# Patient Record
Sex: Female | Born: 1980 | Race: White | Hispanic: No | Marital: Married | Smoking: Never smoker
Health system: Southern US, Community
[De-identification: ages and names within clinical notes are randomized; demographics above are authoritative.]

## PROBLEM LIST (undated history)

## (undated) DIAGNOSIS — R51 Headache: Secondary | ICD-10-CM

## (undated) DIAGNOSIS — T7840XA Allergy, unspecified, initial encounter: Secondary | ICD-10-CM

## (undated) DIAGNOSIS — R519 Headache, unspecified: Secondary | ICD-10-CM

## (undated) DIAGNOSIS — E0500 Thyrotoxicosis with diffuse goiter without thyrotoxic crisis or storm: Secondary | ICD-10-CM

## (undated) DIAGNOSIS — G8929 Other chronic pain: Secondary | ICD-10-CM

## (undated) HISTORY — DX: Allergy, unspecified, initial encounter: T78.40XA

## (undated) HISTORY — DX: Other chronic pain: G89.29

## (undated) HISTORY — DX: Headache, unspecified: R51.9

## (undated) HISTORY — DX: Thyrotoxicosis with diffuse goiter without thyrotoxic crisis or storm: E05.00

## (undated) HISTORY — DX: Headache: R51

---
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
## 2004-01-20 ENCOUNTER — Ambulatory Visit: Payer: Self-pay | Admitting: Internal Medicine

## 2004-07-26 ENCOUNTER — Ambulatory Visit: Payer: Self-pay | Admitting: Internal Medicine

## 2005-05-03 ENCOUNTER — Ambulatory Visit: Payer: Self-pay | Admitting: Obstetrics & Gynecology

## 2005-05-18 ENCOUNTER — Ambulatory Visit (HOSPITAL_COMMUNITY): Admission: RE | Admit: 2005-05-18 | Discharge: 2005-05-18 | Payer: Self-pay | Admitting: Obstetrics & Gynecology

## 2005-05-24 ENCOUNTER — Ambulatory Visit: Payer: Self-pay | Admitting: Obstetrics & Gynecology

## 2008-01-09 HISTORY — PX: OTHER SURGICAL HISTORY: SHX169

## 2008-10-28 ENCOUNTER — Encounter: Payer: Self-pay | Admitting: Obstetrics and Gynecology

## 2009-06-23 ENCOUNTER — Encounter: Payer: Self-pay | Admitting: Obstetrics & Gynecology

## 2009-11-03 ENCOUNTER — Observation Stay: Payer: Self-pay

## 2009-11-04 ENCOUNTER — Ambulatory Visit: Payer: Self-pay

## 2009-11-21 ENCOUNTER — Observation Stay: Payer: Self-pay

## 2010-01-31 ENCOUNTER — Inpatient Hospital Stay: Payer: Self-pay

## 2010-06-09 ENCOUNTER — Ambulatory Visit: Payer: Self-pay | Admitting: General Practice

## 2012-01-09 HISTORY — PX: DILATION AND EVACUATION: SHX1459

## 2012-04-12 IMAGING — CT CT CHEST W/ CM
2 series · 15 of 31 positions shown, 19 images · IV contrast (APPLIED)
Comparison: none

REASON FOR EXAM: Stat CR [id number] shortness of breath eval for PE on
[employer]
COMMENTS:

[Series 4: soft tissue · axial · 0.62mm/px · z∈[-562,-520]mm · 2 of 89 slices shown]
[im 7/89  mediastinal]
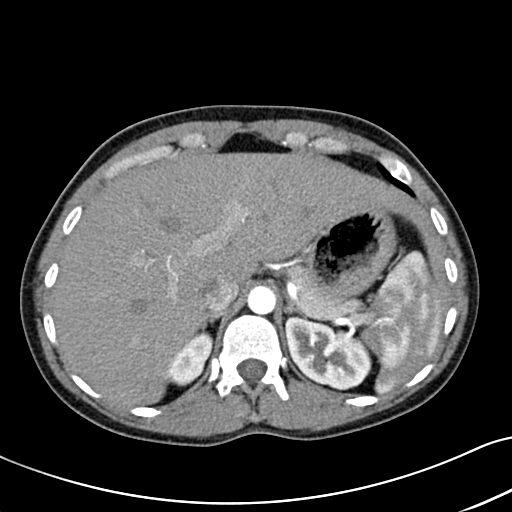
[im 21/89  mediastinal]
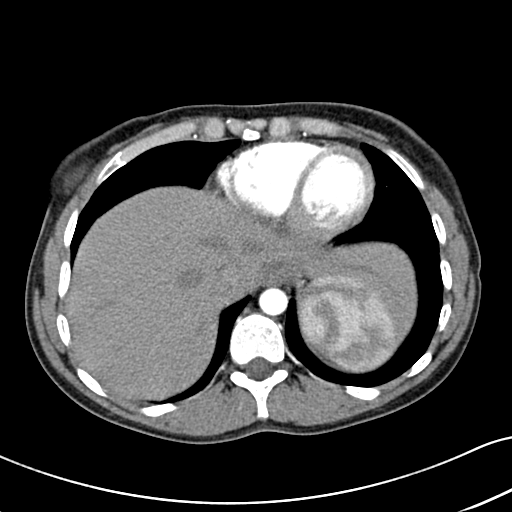

[Series 5: lung windows · axial · 0.62mm/px · z∈[-556,-336]mm · 13 of 87 slices shown, 17 images]
[im 7/87  mediastinal]
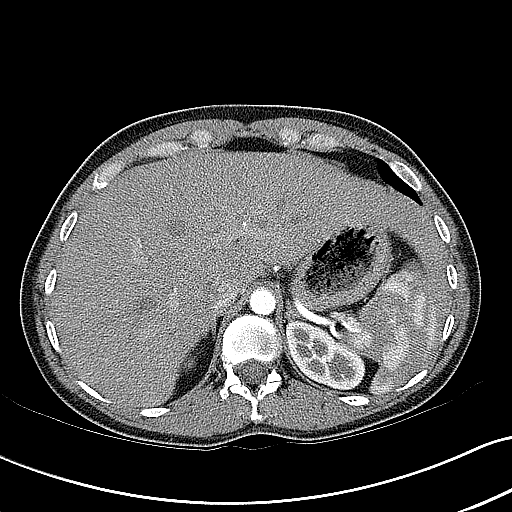
[im 7/87  lung]
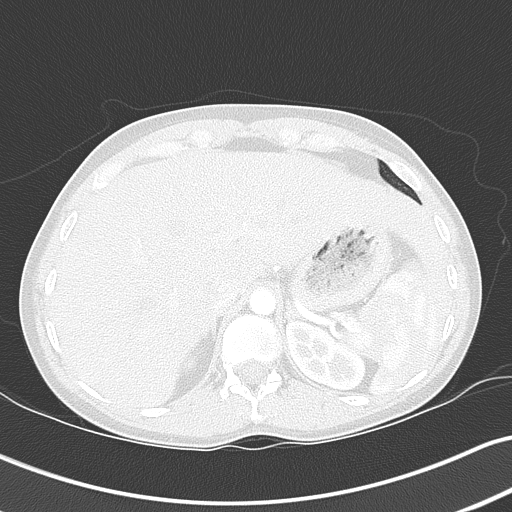
[im 14/87  lung]
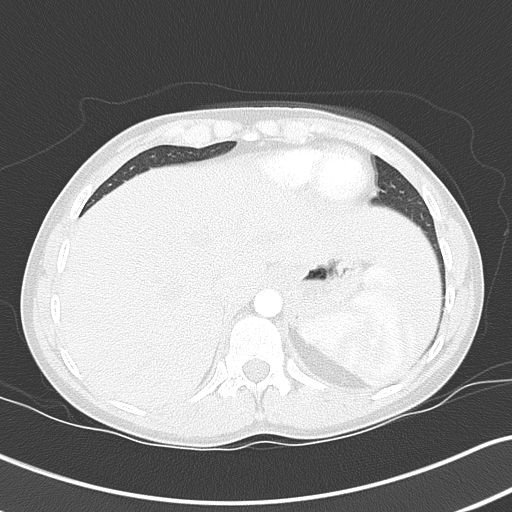
[im 20/87  lung]
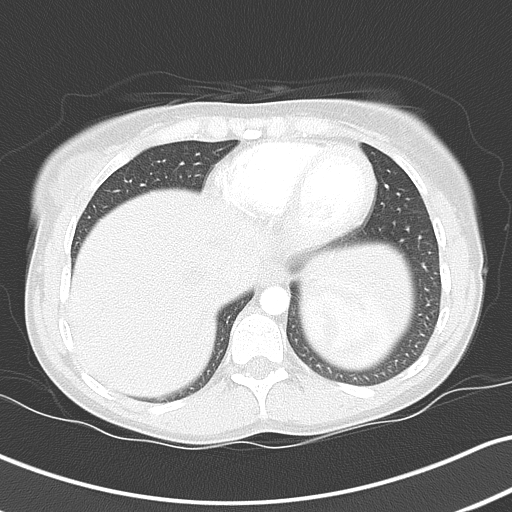
[im 27/87  lung]
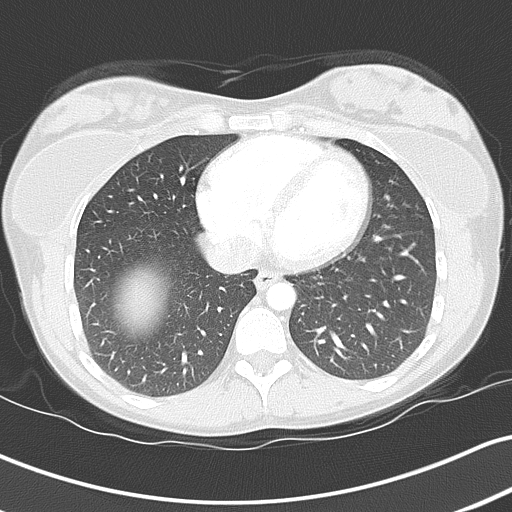
[im 34/87  mediastinal]
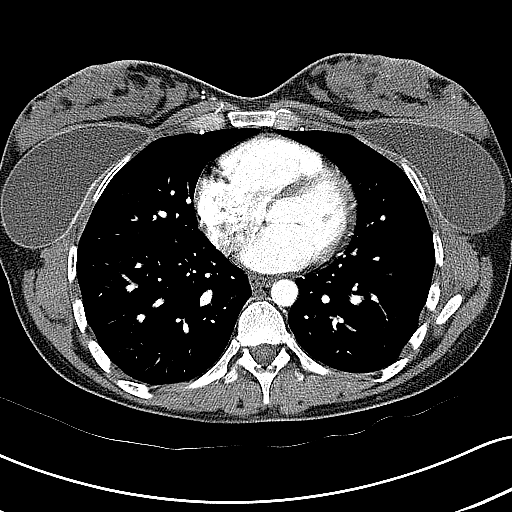
[im 34/87  lung]
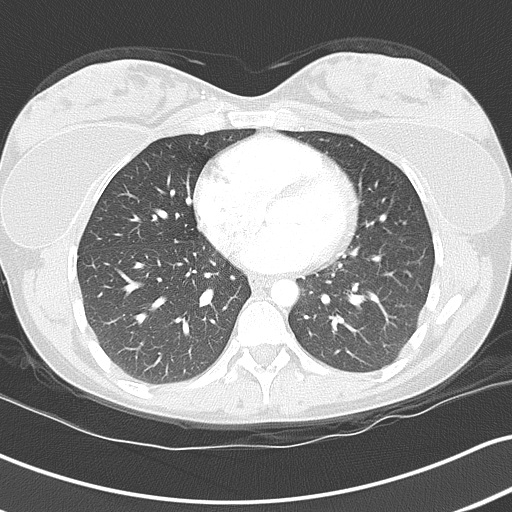
[im 40/87  lung]
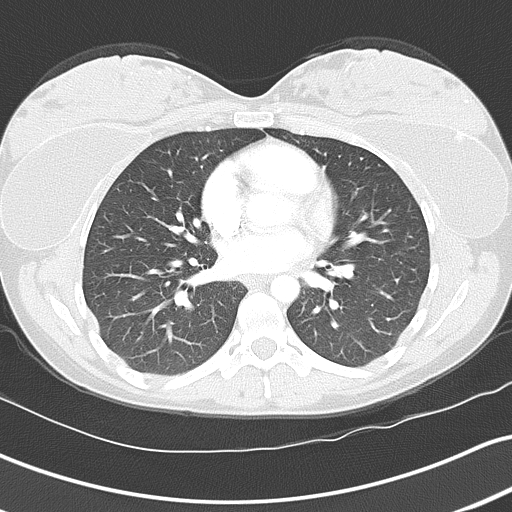
[im 44/87  lung]
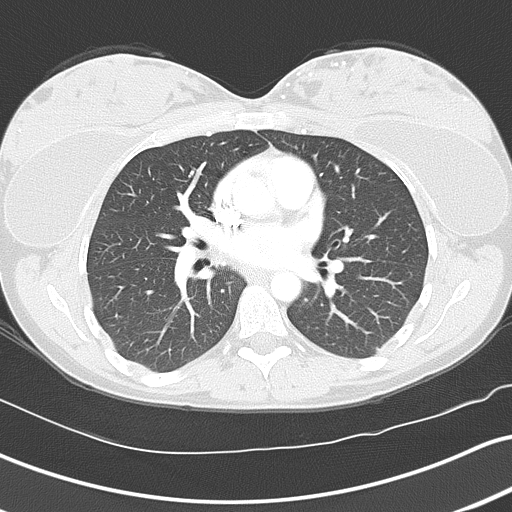
[im 47/87  lung]
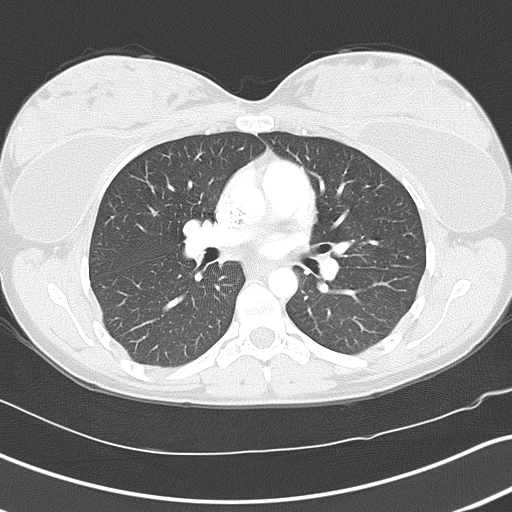
[im 53/87  mediastinal]
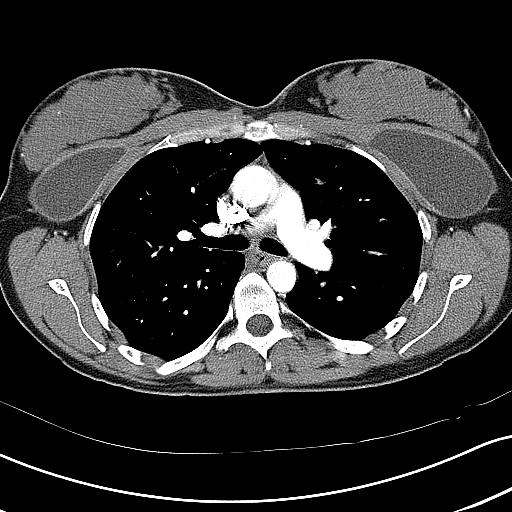
[im 53/87  lung]
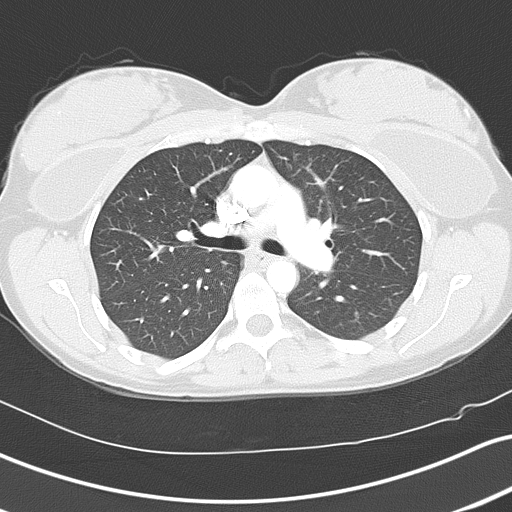
[im 60/87  lung]
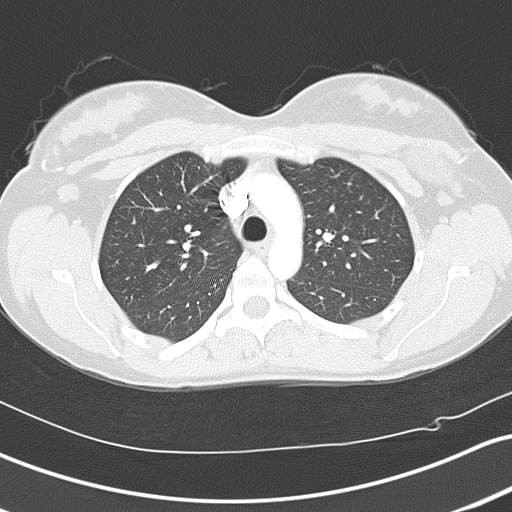
[im 67/87  lung]
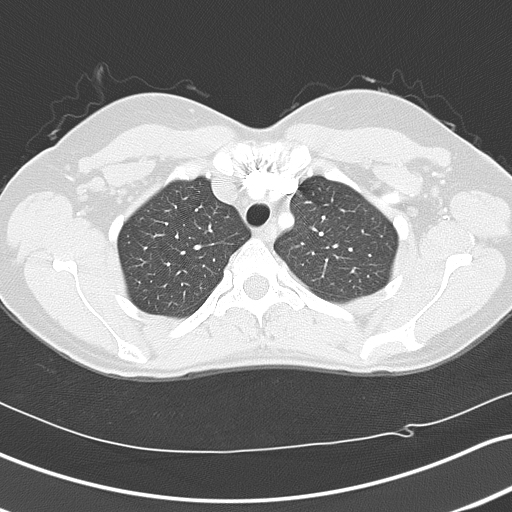
[im 73/87  lung]
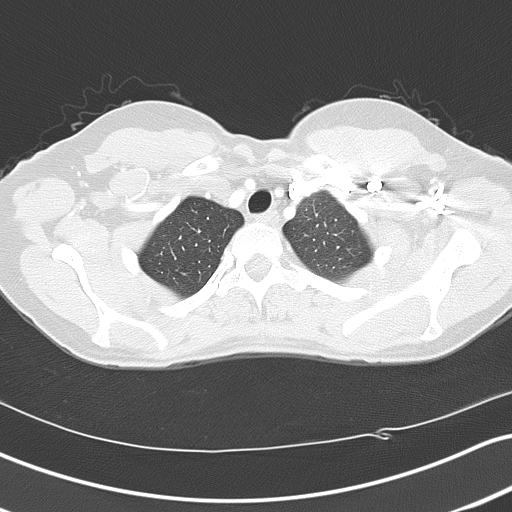
[im 80/87  mediastinal]
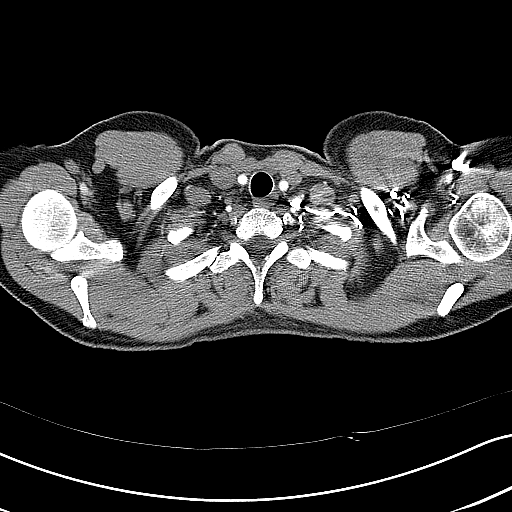
[im 80/87  lung]
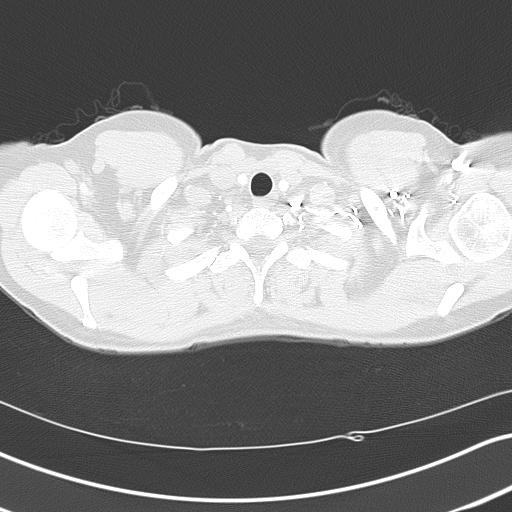

[15 of 31 positions shown; findings below may reference images not displayed]

PROCEDURE:     CT  - CT CHEST (FOR PE) W  - June 09, 2010  [DATE]

RESULT:     Axial CT scanning was performed through the chest at 3 mm
intervals and slice thicknesses. Review of multiplanar reconstructed images
was performed separately on the [employer] monitor. The patient
received 100 cc of Tsovue-7IB for this study.

Contrast within the pulmonary arterial tree is normal in appearance. There
is no evidence of an acute pulmonary embolism. The caliber of the thoracic
aorta is normal. The cardiac chambers are normal in size. The thoracic
esophagus appears normal. There are a few normal sized to borderline
enlarged axillary lymph nodes. I see no pathologic sized mediastinal or
hilar lymph nodes. There is no pleural nor pericardial effusion. There are
bilateral breast implants.

At lung window settings I see no interstitial nor alveolar infiltrates. No
suspicious pulmonary parenchymal nodules are demonstrated. Within the upper
abdomen the observed portions of the liver and spleen are normal. I see no
adrenal masses.
IMPRESSION: 1. There is no evidence of an acute pulmonary embolism.
2. I see no acute thoracic aortic pathology.
3. There is no evidence of CHF nor of pneumonia.
4. There are a few borderline enlarged axillary lymph nodes bilaterally.

A preliminary report was called to the number listed and the report left on
the answering machine of [employer].

## 2012-11-12 ENCOUNTER — Emergency Department: Payer: Self-pay | Admitting: Emergency Medicine

## 2012-11-12 LAB — CBC WITH DIFFERENTIAL/PLATELET
Basophil %: 0.3 %
HCT: 38 % (ref 35.0–47.0)
HGB: 13.6 g/dL (ref 12.0–16.0)
Lymphocyte #: 2.4 10*3/uL (ref 1.0–3.6)
Lymphocyte %: 18.5 %
MCH: 33.4 pg (ref 26.0–34.0)
MCHC: 35.7 g/dL (ref 32.0–36.0)
MCV: 94 fL (ref 80–100)
RBC: 4.06 10*6/uL (ref 3.80–5.20)
WBC: 13.1 10*3/uL — ABNORMAL HIGH (ref 3.6–11.0)

## 2012-11-12 LAB — TROPONIN I: Troponin-I: <0.02 ng/mL

## 2012-11-12 LAB — CK TOTAL AND CKMB (NOT AT ARMC)
CK, Total: 45 U/L (ref 21–215)
CK-MB: 0.7 ng/mL (ref 0.5–3.6)

## 2012-11-12 LAB — COMPREHENSIVE METABOLIC PANEL
Alkaline Phosphatase: 68 U/L (ref 50–136)
Bilirubin,Total: 0.5 mg/dL (ref 0.2–1.0)
Co2: 26 mmol/L (ref 21–32)
Glucose: 83 mg/dL (ref 65–99)
Potassium: 3.5 mmol/L (ref 3.5–5.1)
SGOT(AST): 23 U/L (ref 15–37)
SGPT (ALT): 17 U/L (ref 12–78)
Sodium: 137 mmol/L (ref 136–145)
Total Protein: 6.9 g/dL (ref 6.4–8.2)

## 2013-06-04 ENCOUNTER — Encounter: Payer: Self-pay | Admitting: General Surgery

## 2013-06-23 ENCOUNTER — Encounter: Payer: Self-pay | Admitting: General Surgery

## 2013-06-23 ENCOUNTER — Other Ambulatory Visit: Payer: BC Managed Care – PPO

## 2013-06-23 ENCOUNTER — Ambulatory Visit (INDEPENDENT_AMBULATORY_CARE_PROVIDER_SITE_OTHER): Payer: BC Managed Care – PPO | Admitting: General Surgery

## 2013-06-23 ENCOUNTER — Ambulatory Visit: Payer: Self-pay | Admitting: General Surgery

## 2013-06-23 VITALS — BP 118/68 | HR 74 | Resp 14 | Ht 62.0 in | Wt 136.0 lb

## 2013-06-23 DIAGNOSIS — N63 Unspecified lump in unspecified breast: Secondary | ICD-10-CM

## 2013-06-23 HISTORY — PX: BREAST BIOPSY: SHX20

## 2013-06-23 NOTE — Patient Instructions (Signed)

## 2013-06-23 NOTE — Progress Notes (Addendum)
Patient ID: Rachel Schroeder, female   DOB: June 23, 1980, 33 y.o.   MRN: [id number]  Chief Complaint  Patient presents with  . Other    right breast lump    HPI Rachel Schroeder is a 33 y.o. female who presents for a breast evaluation. Right breast ultrasound 05/09/13.  Patient states she feels a right breast lump that she noticed it approximately 8 weeks ago. She states the area has stayed the same in size.  No known family history of breast cancer. The patient is currently breast feeding. She nursed her first child, now H3 for 2 years without incident. Her second child, 41-1/2 months old has nursed without difficulty. No episodes of mastitis. No trauma to the breast.  HPI  Past Medical History  Diagnosis Date  . Graves disease   . Chronic headaches   . Allergy     Past Surgical History  Procedure Laterality Date  . Breast surgery  2001    implants  . Thyroid ablation  2010  . Dilation and evacuation  2014    History reviewed. No pertinent family history.  Social History History  Substance Use Topics  . Smoking status: Never Smoker   . Smokeless tobacco: Never Used  . Alcohol Use: No    No Known Allergies  Current Outpatient Prescriptions  Medication Sig Dispense Refill  . levothyroxine (SYNTHROID, LEVOTHROID) 112 MCG tablet Take 112 mcg by mouth daily before breakfast.      . loratadine (CLARITIN) 10 MG tablet Take 10 mg by mouth daily as needed for allergies.      . Prenatal Vit-Fe Fumarate-FA (PRENATAL MULTIVITAMIN) TABS tablet Take 1 tablet by mouth daily at 12 noon.      . Progesterone Micronized (PROGESTERONE PO) Take 1 tablet by mouth daily.       No current facility-administered medications for this visit.    Review of Systems Review of Systems  Constitutional: Negative.   Respiratory: Negative.   Cardiovascular: Negative.     Blood pressure 118/68, pulse 74, resp. rate 14, height 5\' 2"  (1.575 m), weight 136 lb (61.689 kg), last menstrual period  06/23/2012.  Physical Exam Physical Exam  Constitutional: She is oriented to person, place, and time. She appears well-developed and well-nourished.  Neck: Neck supple. No thyromegaly present.  Cardiovascular: Normal rate, regular rhythm and normal heart sounds.   No murmur heard. Pulmonary/Chest: Effort normal and breath sounds normal. Right breast exhibits mass. Right breast exhibits no inverted nipple, no nipple discharge, no skin change and no tenderness. Left breast exhibits no inverted nipple, no mass, no nipple discharge, no skin change and no tenderness.    Lymphadenopathy:    She has no cervical adenopathy.    She has no axillary adenopathy.  Neurological: She is alert and oriented to person, place, and time.  Skin: Skin is warm and dry.    Data Reviewed EXAM INFO:  [id number] UN 05/29/13 15:28:41 [id number] [facility]) : US BREAST LIMITED RIGHT  DICTATED: 05/29/13 15:36:00  INTERPRETATION LOCATION: [place]  CLINICAL INDICATION: 33 Year Old (F) with 611.72 - Lump in lactating female breast, LUMP R SIDE 11 OCLOCK 1.5CM  TECHNIQUE: Right targeted diagnostic breast ultrasound  BREAST COMPOSITION: b. There are scattered areas of fibroglandular density.  FINDINGS:  Targeted ultrasound of the right breast upper outer quadrant at the specific site of clinical concern is negative for suspicious masses or associated features. Incidentally noted is her right breast implant  COMPARISON: None  ASSESSMENT: BI-RADS CATEGORY 2: BENIGN.  MANAGEMENT RECOMMENDATIONS: Clinical followup.  These results and recommendations were discussed with the patient.   Ultrasound examination of the palpable mass in the upper-outer quadrant of the right breast at the 11:00 position, 12 cm from the nipple shows a hyperechoic lesion with ill-defined borders measuring 1.5 x 2.3 x 2.53 cm. This is located just above the pectoral fascia and above the underlying implant. Focal cystic areas were identified. No  increased vascularity on duplex imaging.  Due the appearance of this unusual lesion core biopsy was recommended and amenable to the patient. 10 cc of 0.5% Xylocaine with 0.25% Marcaine with 1-200,000 units of epinephrine was instilled under ultrasound guidance. The majority was deposited the local lesion to elevate the mass earlier away from the pectoralis fascia and the underlying implant.  Under ultrasound guidance a 14-gauge Finesse device was advanced into the lesion. The device was pre-fired to minimize risk to the underlying implant. Multiple core samples were obtained. Moderate vascularity was noted as each biopsy sample resulted in a blush of fluid. When the biopsy device was removed after 5 core samples approximately 5 cc of blood was expressed from the wound. Direct pressure was held for 5 minutes. Repeat ultrasound imaging showed no hematoma. The skin defect was closed with benzoin and Steri-Strips followed by Telfa and Tegaderm dressing. The patient tolerated the procedure well.  Assessment    Right breast mass.    Plan    The patient was provided written instructions regarding wound care. She'll be contacted when the pathology results are available. The specimen was handcarried to pathology.      PCP: Deliah Boston 06/24/2013, 12:31 PM

## 2013-06-24 ENCOUNTER — Telehealth: Payer: Self-pay | Admitting: General Surgery

## 2013-06-24 DIAGNOSIS — N63 Unspecified lump in unspecified breast: Secondary | ICD-10-CM | POA: Insufficient documentation

## 2013-06-24 LAB — PATHOLOGY

## 2013-06-24 NOTE — Telephone Encounter (Signed)
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  The patient has been notified of these results. She reports moderate soreness and bruising. She will continue use ice today and then initiated he tomorrow. Nursing follow up in one week. We'll plan on keeping the position followup scheduled for one month.

## 2013-06-30 ENCOUNTER — Ambulatory Visit (INDEPENDENT_AMBULATORY_CARE_PROVIDER_SITE_OTHER): Payer: BC Managed Care – PPO | Admitting: *Deleted

## 2013-06-30 ENCOUNTER — Ambulatory Visit: Payer: BC Managed Care – PPO

## 2013-06-30 DIAGNOSIS — N63 Unspecified lump in unspecified breast: Secondary | ICD-10-CM

## 2013-06-30 NOTE — Progress Notes (Signed)
Patient came in today for a wound check right breast excision . The wound is clean, with no signs of infection noted.   Dressing and steri strip was removed.  Minimal bruising noted.  The patient is aware that a heating pad may be used for comfort as needed.  Aware of pathology.  Follow up as scheduled.

## 2013-07-01 ENCOUNTER — Ambulatory Visit: Payer: Self-pay | Admitting: General Surgery

## 2013-07-28 ENCOUNTER — Ambulatory Visit: Payer: BC Managed Care – PPO | Admitting: General Surgery

## 2013-08-06 ENCOUNTER — Ambulatory Visit: Payer: BC Managed Care – PPO | Admitting: General Surgery

## 2013-08-17 ENCOUNTER — Ambulatory Visit (INDEPENDENT_AMBULATORY_CARE_PROVIDER_SITE_OTHER): Payer: BC Managed Care – PPO | Admitting: General Surgery

## 2013-08-17 ENCOUNTER — Encounter: Payer: Self-pay | Admitting: General Surgery

## 2013-08-17 VITALS — BP 118/74 | HR 76 | Resp 12 | Ht 64.0 in | Wt 134.0 lb

## 2013-08-17 DIAGNOSIS — D241 Benign neoplasm of right breast: Secondary | ICD-10-CM

## 2013-08-17 DIAGNOSIS — D249 Benign neoplasm of unspecified breast: Secondary | ICD-10-CM

## 2013-08-17 DIAGNOSIS — N63 Unspecified lump in unspecified breast: Secondary | ICD-10-CM

## 2013-08-17 NOTE — Progress Notes (Signed)
Patient ID: Rachel Schroeder, female   DOB: 1980-01-13, 33 y.o.   MRN: [id number]  Chief Complaint  Patient presents with  . Follow-up    excision right breast follow up    HPI Rachel Schroeder is a 33 y.o. female here today following up from her right breast biopsy done on 06/23/13. Patient states she is doing well.  HPI  Past Medical History  Diagnosis Date  . Graves disease   . Chronic headaches   . Allergy     Past Surgical History  Procedure Laterality Date  . Thyroid ablation  2010  . Dilation and evacuation  2014  . Breast surgery  2001  . Breast biopsy Right 06/23/13    No family history on file.  Social History History  Substance Use Topics  . Smoking status: Never Smoker   . Smokeless tobacco: Never Used  . Alcohol Use: No    No Known Allergies  Current Outpatient Prescriptions  Medication Sig Dispense Refill  . levothyroxine (SYNTHROID, LEVOTHROID) 112 MCG tablet Take 112 mcg by mouth daily before breakfast.      . loratadine (CLARITIN) 10 MG tablet Take 10 mg by mouth daily as needed for allergies.      . Prenatal Vit-Fe Fumarate-FA (PRENATAL MULTIVITAMIN) TABS tablet Take 1 tablet by mouth daily at 12 noon.      . Progesterone Micronized (PROGESTERONE PO) Take 1 tablet by mouth daily.       No current facility-administered medications for this visit.    Review of Systems Review of Systems  Constitutional: Negative.   Respiratory: Negative.   Cardiovascular: Negative.     Blood pressure 118/74, pulse 76, resp. rate 12, height 5\' 4"  (1.626 m), weight 134 lb (60.782 kg).  Physical Exam Physical Exam  Constitutional: She is oriented to person, place, and time. She appears well-developed and well-nourished.  Eyes: Conjunctivae are normal. No scleral icterus.  Cardiovascular: Normal rate, regular rhythm and normal heart sounds.   Pulmonary/Chest: Effort normal and breath sounds normal. Right breast exhibits no inverted nipple, no mass, no nipple  discharge, no skin change and no tenderness. Left breast exhibits no inverted nipple, no nipple discharge, no skin change and no tenderness.    Neurological: She is alert and oriented to person, place, and time.  Skin: Skin is warm and dry.    Data Reviewed Pathology showed changes consistent with a lactating adenoma at the time of her July 2015 biopsy.  Assessment    Benign breast exam.     Plan    The patient was informed that the prominence in the right axillary tail should fade within the first 6-12 months after she discontinues nursing. If this does not occur, if the area largest or become symptomatic she was encouraged to call for reassessment.   PCP: Deliah Boston 08/17/2013, 8:49 PM

## 2013-08-17 NOTE — Patient Instructions (Signed)
Patient to return as needed. 

## 2013-11-09 ENCOUNTER — Encounter: Payer: Self-pay | Admitting: General Surgery

## 2014-09-16 IMAGING — US US EXTREM LOW VENOUS BILAT
1 series · 14 of 24 positions shown · non-contrast
Comparison: None.

CLINICAL DATA: Chest pain. Leg swelling.

EXAM:
VENOUS DOPPLER ULTRASOUND OF BILATERAL LOWER EXTREMITIES
TECHNIQUE: Gray-scale sonography with graded compression, as well as color
Doppler and duplex ultrasound, were performed to evaluate the deep
venous system from the level of the common femoral vein through the
popliteal and proximal calf veins. Spectral Doppler was utilized to
evaluate flow at rest and with distal augmentation maneuvers.

[Series 1: us extrem low venous bilat · 0.08mm/px · 14 of 76 slices shown]
[im 1/76]
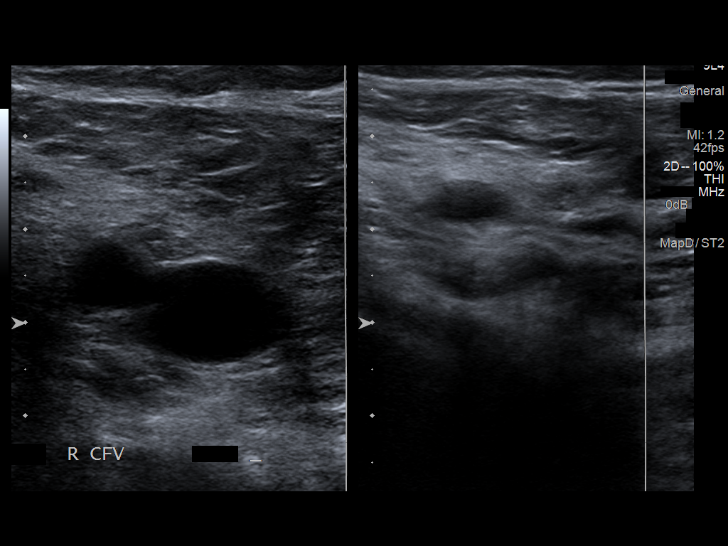
[im 7/76]
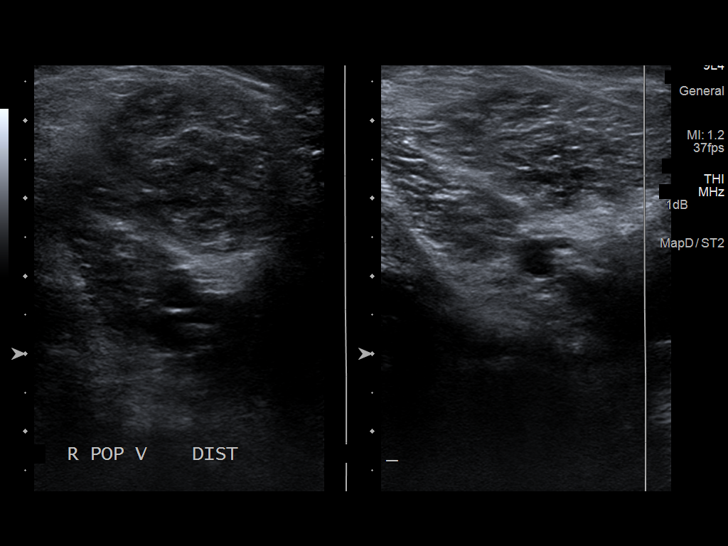
[im 14/76]
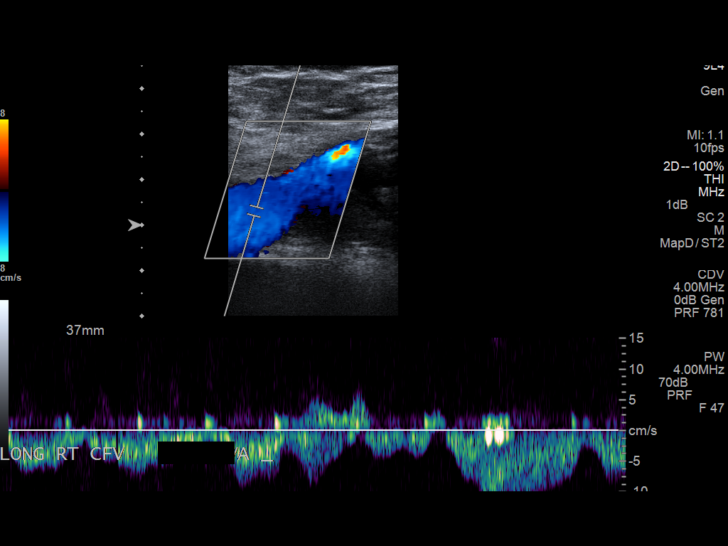
[im 20/76]
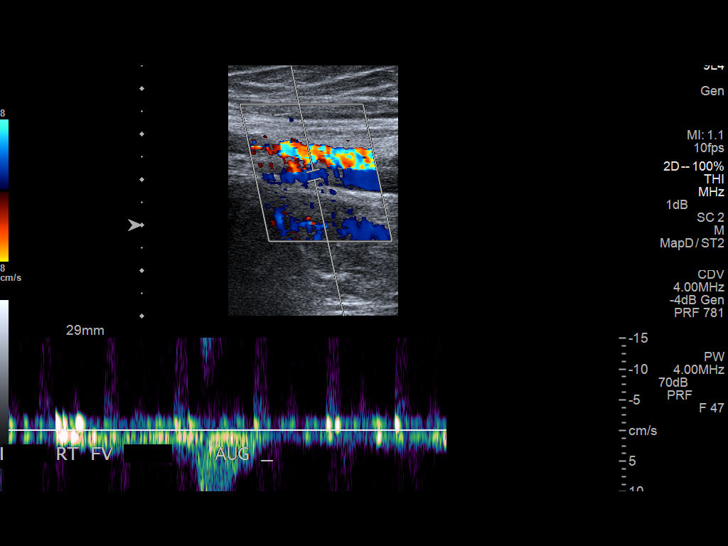
[im 23/76]
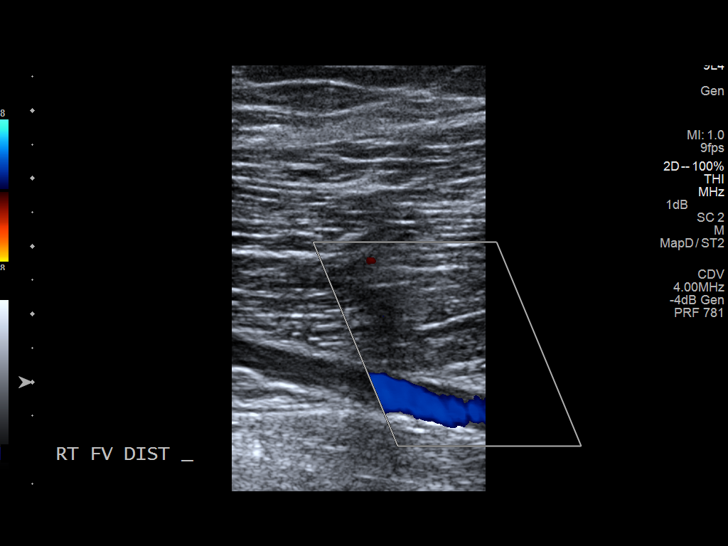
[im 30/76]
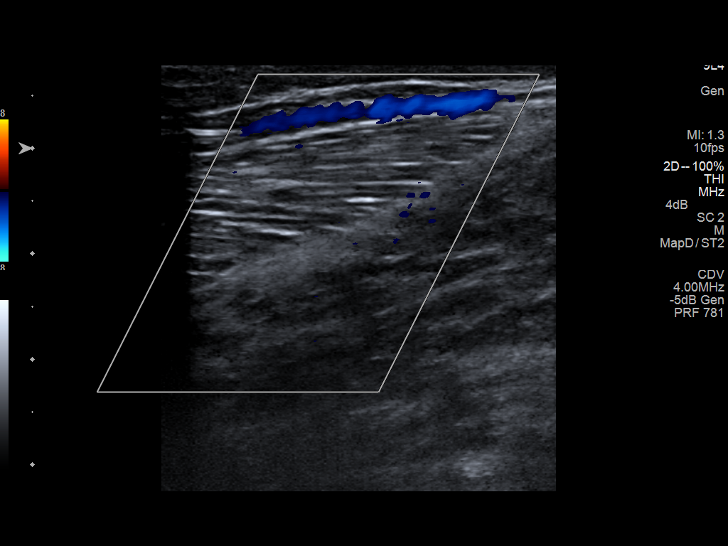
[im 36/76]
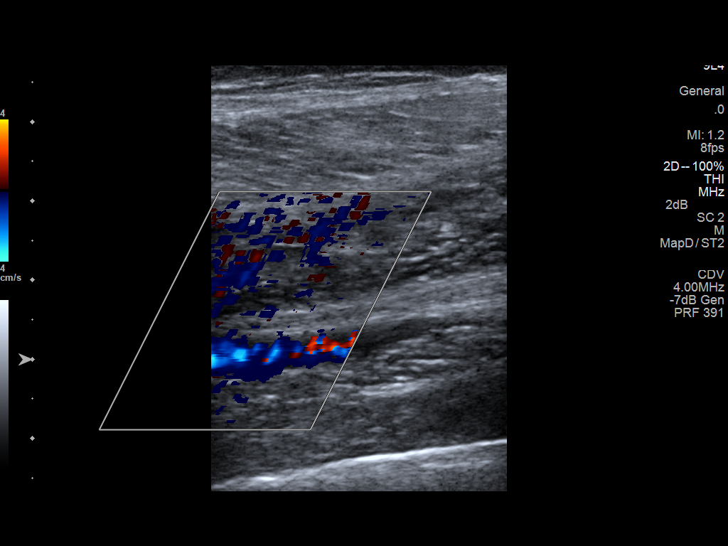
[im 40/76]
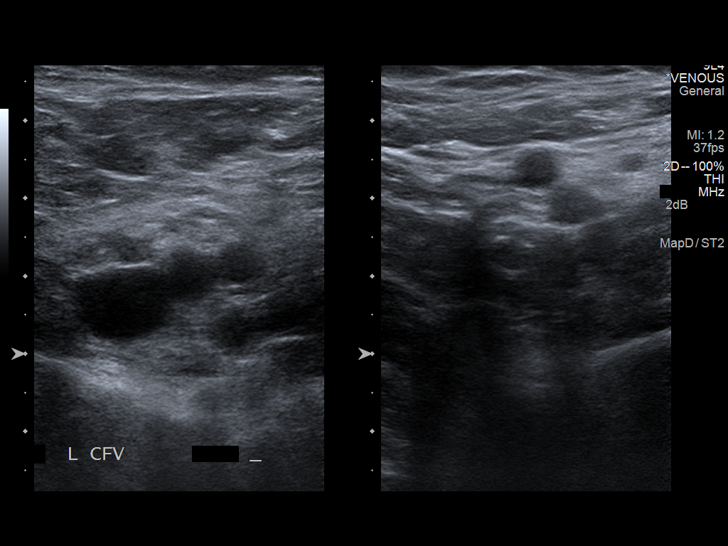
[im 46/76]
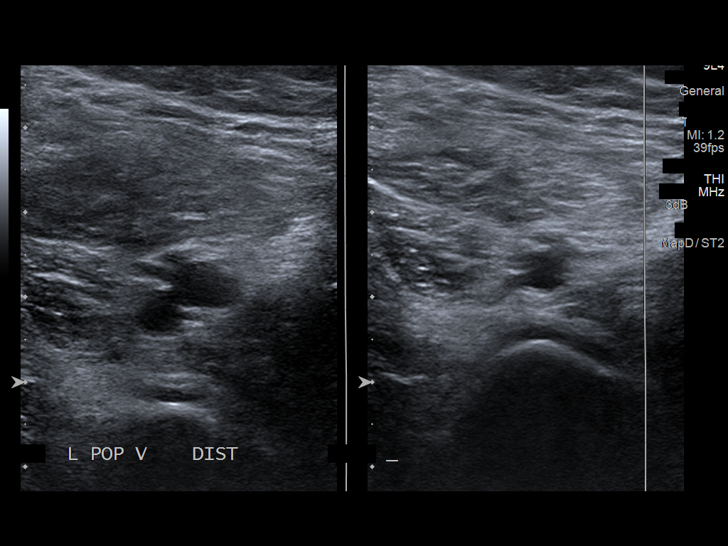
[im 53/76]
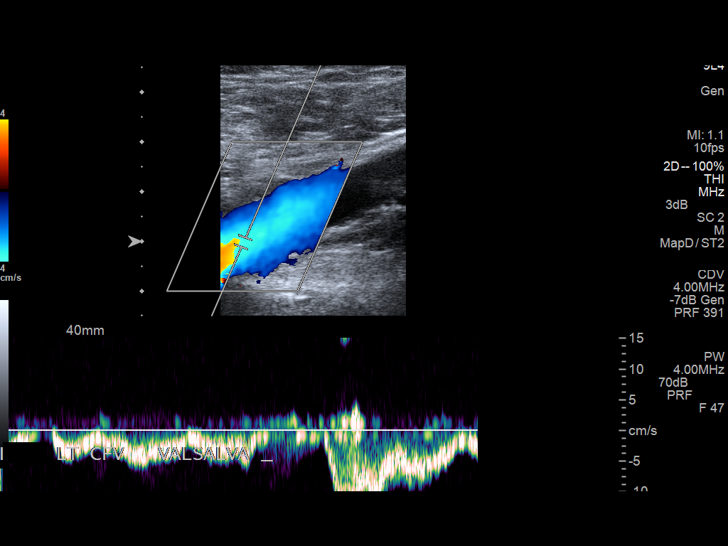
[im 59/76]
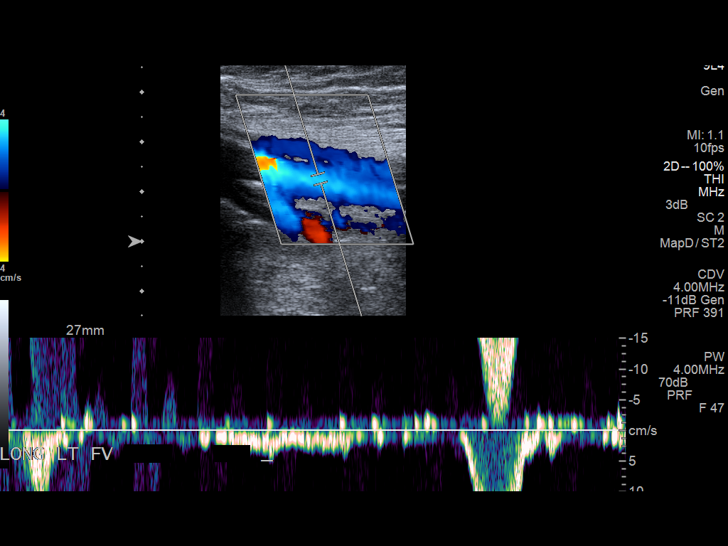
[im 62/76]
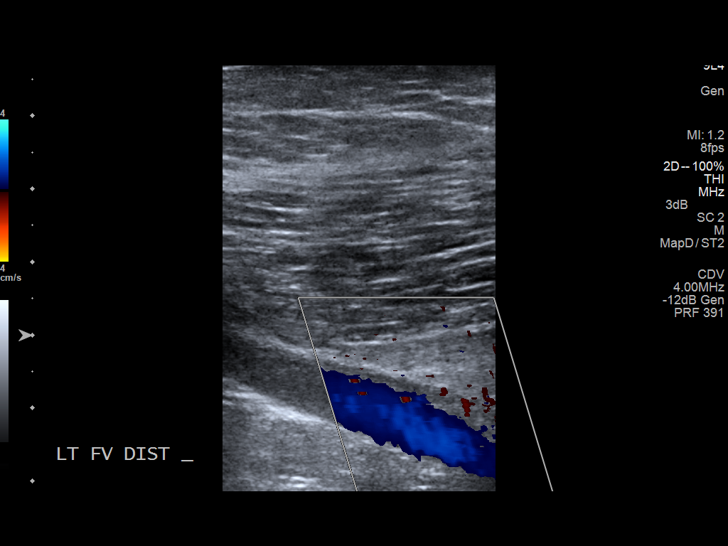
[im 69/76]
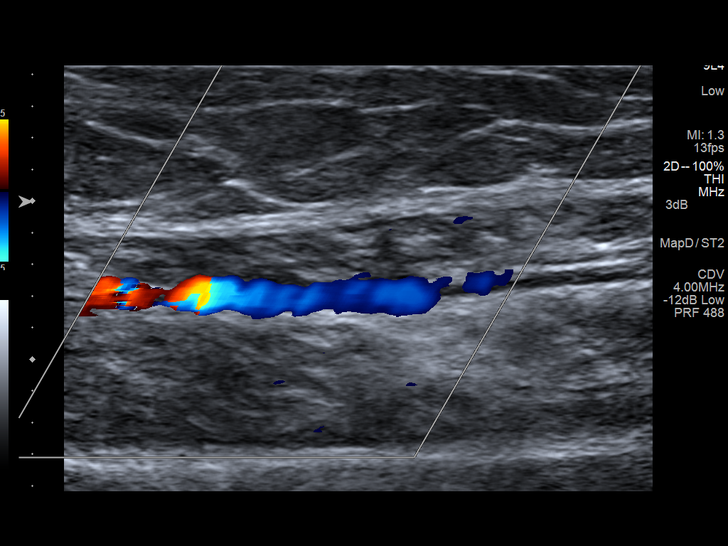
[im 76/76]
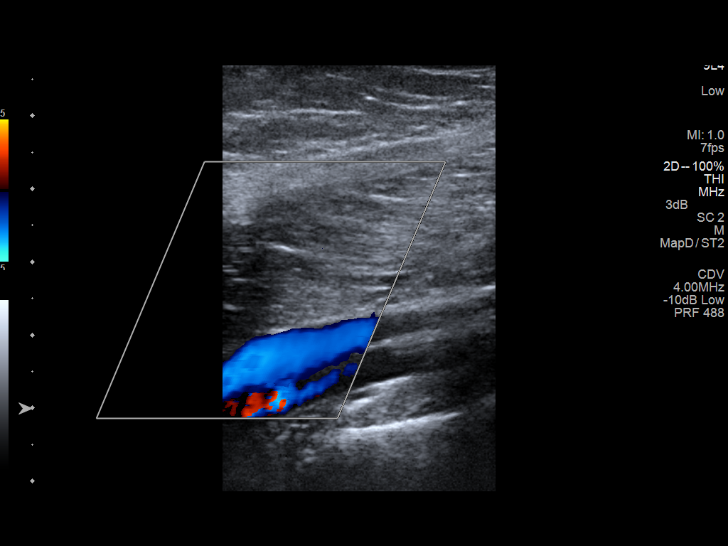

[14 of 24 positions shown; findings below may reference images not displayed]

FINDINGS: Thrombus within deep veins:  None visualized.

Compressibility of deep veins:  Normal.

Duplex waveform respiratory phasicity:  Normal.

Duplex waveform response to augmentation:  Normal.

Venous reflux:  None visualized.

Other findings:  None visualized.
IMPRESSION: Negative for DVT within the bilateral lower extremities.
